# Patient Record
Sex: Female | Born: 1990 | Race: Asian | Hispanic: No | Marital: Single
Health system: Southern US, Community
[De-identification: ages and names within clinical notes are randomized; demographics above are authoritative.]

---

## 2013-08-10 ENCOUNTER — Emergency Department (INDEPENDENT_AMBULATORY_CARE_PROVIDER_SITE_OTHER)
Admission: EM | Admit: 2013-08-10 | Discharge: 2013-08-10 | Disposition: A | Discharge status: Home / Self Care | Payer: Managed Care, Other (non HMO) | Source: Home / Self Care | Attending: Emergency Medicine | Admitting: Emergency Medicine

## 2013-08-10 ENCOUNTER — Encounter (HOSPITAL_COMMUNITY): Payer: Self-pay | Admitting: Emergency Medicine

## 2013-08-10 DIAGNOSIS — K3189 Other diseases of stomach and duodenum: Secondary | ICD-10-CM

## 2013-08-10 DIAGNOSIS — K30 Functional dyspepsia: Secondary | ICD-10-CM

## 2013-08-10 DIAGNOSIS — R1013 Epigastric pain: Secondary | ICD-10-CM

## 2013-08-10 MED ORDER — GI COCKTAIL ~~LOC~~
ORAL | Status: AC
  Filled 2013-08-10: qty 30

## 2013-08-10 MED ORDER — GI COCKTAIL ~~LOC~~
30.0000 mL | Freq: Once | ORAL | Status: AC
  Administered 2013-08-10: 30 mL via ORAL

## 2013-08-10 NOTE — ED Provider Notes (Signed)
CSN: [id number]     Arrival date & time 08/10/13  1803 History   First MD Initiated Contact with Patient 08/10/13 1831     Chief Complaint  Patient presents with  . Chest Pain   (Consider location/radiation/quality/duration/timing/severity/associated sxs/prior Treatment) HPI  23 year old F with chest pain. Located in subxiphoid area. Nonradiating. 3 days duration. Associated with intermittent shortness of breath. She has no history of pneumonia, blood clots, or cardiac disease. She takes no daily medications. She has tried ibuprofen for this without relief. She denies fever, chills, headache, swelling of her legs. No diarrhea or constipation. No nausea or vomiting.  History reviewed. No pertinent past medical history. No past surgical history on file. No family history on file. History  Substance Use Topics  . Smoking status: Not on file  . Smokeless tobacco: Not on file  . Alcohol Use: Not on file   OB History   Grav Para Term Preterm Abortions TAB SAB Ect Mult Living                 Review of Systems See history of present illness Allergies  Review of patient's allergies indicates no known allergies.  Home Medications   Prior to Admission medications   Not on File   BP 125/80  Pulse 74  Temp(Src) 99.1 F (37.3 C) (Oral)  Resp 14  SpO2 100%  LMP 07/18/2013 Physical Exam Gen: young asian female, well appearing, NAD, pleasant and conversant CV: RRR, no m/r/g, no JVD or carotid bruits, no chest wall tenderness Pulm: normal WOB, CTA-B Abd: soft, NDNT, NABS; mild tenderness to palpation subxiphoid area without any masses Extremities: no edema or joint tenderness Neuro/Psych: A&Ox4, normal affect, speech, and thought content  ED Course  Procedures (including critical care time) Labs Review Labs Reviewed - No data to display  Imaging Review No results found.   Date: 08/10/2013  Rate: 64  Rhythm: normal sinus rhythm  QRS Axis: normal  Intervals: normal  ST/T  Wave abnormalities: nonspecific ST changes  Conduction Disutrbances:none  Narrative Interpretation: normal ECG  Old EKG Reviewed: none available     MDM   1. Indigestion    Pt given GI cocktail with relief. This corroborates diagnosis of indigestion. Recommended tums or mylanta with prilosec if that does not get better. Pt agreeable.     Garnetta BuddyEdward V Jamesetta Greenhalgh, MD 08/10/13 [phone]

## 2013-08-10 NOTE — Discharge Instructions (Signed)
You should take mylanta or tums for next few days to see if this helps. If this does not help, then you can try Prilosec. You can buy that at the pharmacy. If the pain gets worse or you have shortness of breath, then please come back to the urgent care.  Sincerely,   Dr. Clinton SawyerWilliamson  Indigestion Indigestion is discomfort in the upper abdomen that is caused by underlying problems such as gastroesophageal reflux disease (GERD), ulcers, or gallbladder problems.  CAUSES  Indigestion can be caused by many things. Possible causes include:  Stomach acid in the esophagus.  Stomach infections, usually caused by the bacteria H. pylori.  Being overweight.  Hiatal hernia. This means part of the stomach pushes up through the diaphragm.  Overeating.  Emotional problems, such as stress, anxiety, or depression.  Poor nutrition.  Consuming too much alcohol, tobacco, or caffeine.  Consuming spicy foods, fats, peppermint, chocolate, tomato products, citrus, or fruit juices.  Medicines such as aspirin and other anti-inflammatory drugs, hormones, steroids, and thyroid medicines.  Gastroparesis. This is a condition in which the stomach does not empty properly.  Stomach cancer.  Pregnancy, due to an increase in hormone levels, a relaxation of muscles in the digestive tract, and pressure on the stomach from the growing fetus. SYMPTOMS   Uncomfortable feeling of fullness after eating.  Pain or burning sensation in the upper abdomen.  Bloating.  Belching and gas.  Nausea and vomiting.  Acidic taste in the mouth.  Burning sensation in the chest (heartburn). DIAGNOSIS  Your caregiver will review your medical history and perform a physical exam. Other tests, such as blood tests, stool tests, X-rays, and other imaging scans, may be done to check for more serious problems. TREATMENT  Liquid antacids and other drugs may be given to block stomach acid secretion. Medicines that increase esophageal  muscle tone may also be given to help reduce symptoms. If an infection is found, antibiotic medicine may be given. HOME CARE INSTRUCTIONS  Avoid foods and drinks that make your symptoms worse, such as:  Caffeine or alcoholic drinks.  Chocolate.  Peppermint or mint flavorings.  Garlic and onions.  Spicy foods.  Citrus fruits, such as oranges, lemons, or limes.  Tomato-based foods such as sauce, chili, salsa, and pizza.  Fried and fatty foods.  Avoid eating for the 3 hours prior to your bedtime.  Eat small, frequent meals instead of large meals.  Stop smoking if you smoke.  Maintain a healthy weight.  Wear loose-fitting clothing. Do not wear anything tight around your waist that causes pressure on your stomach.  Raise the head of your bed 4 to 8 inches with wood blocks to help you sleep. Extra pillows will not help.  Only take over-the-counter or prescription medicines as directed by your caregiver.  Do not take aspirin, ibuprofen, or other nonsteroidal anti-inflammatory drugs (NSAIDs). SEEK IMMEDIATE MEDICAL CARE IF:   You are not better after 2 days.  You have chest pressure or pain that radiates up into your neck, arms, back, jaw, or upper abdomen.  You have difficulty swallowing.  You keep vomiting.  You have black or bloody stools.  You have a fever.  You have dizziness, fainting, difficulty breathing, or heavy sweating.  You have severe abdominal pain.  You lose weight without trying. MAKE SURE YOU:  Understand these instructions.  Will watch your condition.  Will get help right away if you are not doing well or get worse. Document Released: 04/18/2004 Document Revised: 06/03/2011  Document Reviewed: 10/24/2010 [facility] Patient Information 2014 [place], [place].

## 2013-08-10 NOTE — ED Notes (Signed)
C/o mid chest pain States she does have a lot of headache Does eat spicy food Did take advil as tx

## 2013-08-12 NOTE — ED Provider Notes (Signed)
Medical screening examination/treatment/procedure(s) were performed by a resident physician and as supervising physician I was immediately available for consultation/collaboration.  Leslee Homeavid Rithwik Schmieg, M.D.  Reuben Likesavid C Develle Sievers, MD 08/12/13 [phone]

## 2015-08-05 ENCOUNTER — Emergency Department (HOSPITAL_COMMUNITY): Payer: Managed Care, Other (non HMO)

## 2015-08-05 ENCOUNTER — Emergency Department (HOSPITAL_COMMUNITY)
Admission: EM | Admit: 2015-08-05 | Discharge: 2015-08-05 | Disposition: A | Discharge status: Home / Self Care | Payer: Managed Care, Other (non HMO) | Attending: Emergency Medicine | Admitting: Emergency Medicine

## 2015-08-05 ENCOUNTER — Encounter (HOSPITAL_COMMUNITY): Payer: Self-pay | Admitting: Adult Health

## 2015-08-05 DIAGNOSIS — M542 Cervicalgia: Secondary | ICD-10-CM | POA: Insufficient documentation

## 2015-08-05 DIAGNOSIS — R509 Fever, unspecified: Secondary | ICD-10-CM

## 2015-08-05 DIAGNOSIS — R0602 Shortness of breath: Secondary | ICD-10-CM | POA: Diagnosis not present

## 2015-08-05 DIAGNOSIS — R51 Headache: Secondary | ICD-10-CM | POA: Insufficient documentation

## 2015-08-05 DIAGNOSIS — K529 Noninfective gastroenteritis and colitis, unspecified: Secondary | ICD-10-CM | POA: Insufficient documentation

## 2015-08-05 DIAGNOSIS — R05 Cough: Secondary | ICD-10-CM

## 2015-08-05 DIAGNOSIS — Z792 Long term (current) use of antibiotics: Secondary | ICD-10-CM | POA: Diagnosis not present

## 2015-08-05 DIAGNOSIS — Z79899 Other long term (current) drug therapy: Secondary | ICD-10-CM | POA: Diagnosis not present

## 2015-08-05 DIAGNOSIS — Z3202 Encounter for pregnancy test, result negative: Secondary | ICD-10-CM | POA: Insufficient documentation

## 2015-08-05 DIAGNOSIS — F509 Eating disorder, unspecified: Secondary | ICD-10-CM | POA: Diagnosis present

## 2015-08-05 DIAGNOSIS — M545 Low back pain: Secondary | ICD-10-CM | POA: Insufficient documentation

## 2015-08-05 DIAGNOSIS — R1033 Periumbilical pain: Secondary | ICD-10-CM

## 2015-08-05 DIAGNOSIS — R110 Nausea: Secondary | ICD-10-CM

## 2015-08-05 DIAGNOSIS — R059 Cough, unspecified: Secondary | ICD-10-CM

## 2015-08-05 DIAGNOSIS — R197 Diarrhea, unspecified: Secondary | ICD-10-CM

## 2015-08-05 LAB — CBC WITH DIFFERENTIAL/PLATELET
BASOS ABS: 0 10*3/uL (ref 0.0–0.1)
Basophils Relative: 0 %
Eosinophils Absolute: 0 10*3/uL (ref 0.0–0.7)
Eosinophils Relative: 0 %
HEMATOCRIT: 39.4 % (ref 36.0–46.0)
Hemoglobin: 12.8 g/dL (ref 12.0–15.0)
Lymphocytes Relative: 8 %
Lymphs Abs: 0.9 10*3/uL (ref 0.7–4.0)
MCH: 26.4 pg (ref 26.0–34.0)
MCHC: 32.5 g/dL (ref 30.0–36.0)
MCV: 81.2 fL (ref 78.0–100.0)
Monocytes Absolute: 0.5 10*3/uL (ref 0.1–1.0)
Monocytes Relative: 5 %
NEUTROS ABS: 10 10*3/uL — AB (ref 1.7–7.7)
Neutrophils Relative %: 87 %
Platelets: 173 10*3/uL (ref 150–400)
RBC: 4.85 MIL/uL (ref 3.87–5.11)
RDW: 13 % (ref 11.5–15.5)
WBC: 11.4 10*3/uL — ABNORMAL HIGH (ref 4.0–10.5)

## 2015-08-05 LAB — COMPREHENSIVE METABOLIC PANEL
ALT: 11 U/L — AB (ref 14–54)
AST: 19 U/L (ref 15–41)
Albumin: 3.5 g/dL (ref 3.5–5.0)
Alkaline Phosphatase: 55 U/L (ref 38–126)
Anion gap: 11 (ref 5–15)
BILIRUBIN TOTAL: 0.5 mg/dL (ref 0.3–1.2)
BUN: 9 mg/dL (ref 6–20)
CO2: 22 mmol/L (ref 22–32)
CREATININE: 0.97 mg/dL (ref 0.44–1.00)
Calcium: 8.9 mg/dL (ref 8.9–10.3)
Chloride: 103 mmol/L (ref 101–111)
Glucose, Bld: 108 mg/dL — ABNORMAL HIGH (ref 65–99)
Potassium: 3.9 mmol/L (ref 3.5–5.1)
Sodium: 136 mmol/L (ref 135–145)
Total Protein: 7.7 g/dL (ref 6.5–8.1)

## 2015-08-05 LAB — URINE MICROSCOPIC-ADD ON

## 2015-08-05 LAB — URINALYSIS, ROUTINE W REFLEX MICROSCOPIC
Bilirubin Urine: NEGATIVE
Glucose, UA: NEGATIVE mg/dL
Ketones, ur: 15 mg/dL — AB
Nitrite: NEGATIVE
Protein, ur: 30 mg/dL — AB
Specific Gravity, Urine: 1.024 (ref 1.005–1.030)
pH: 6 (ref 5.0–8.0)

## 2015-08-05 LAB — RAPID STREP SCREEN (MED CTR MEBANE ONLY): Streptococcus, Group A Screen (Direct): NEGATIVE

## 2015-08-05 LAB — I-STAT CG4 LACTIC ACID, ED
LACTIC ACID, VENOUS: 1.54 mmol/L (ref 0.5–2.0)
Lactic Acid, Venous: 0.67 mmol/L (ref 0.5–2.0)

## 2015-08-05 LAB — PREGNANCY, URINE: Preg Test, Ur: NEGATIVE

## 2015-08-05 LAB — MONONUCLEOSIS SCREEN: MONO SCREEN: NEGATIVE

## 2015-08-05 MED ORDER — ACETAMINOPHEN 325 MG PO TABS
650.0000 mg | ORAL_TABLET | Freq: Once | ORAL | Status: AC | PRN
  Administered 2015-08-05: 650 mg via ORAL
  Filled 2015-08-05: qty 2

## 2015-08-05 MED ORDER — METRONIDAZOLE 500 MG PO TABS: 500.0000 mg | ORAL_TABLET | Freq: Two times a day (BID) | ORAL | Status: AC

## 2015-08-05 MED ORDER — ALBUTEROL SULFATE HFA 108 (90 BASE) MCG/ACT IN AERS
1.0000 | INHALATION_SPRAY | Freq: Four times a day (QID) | RESPIRATORY_TRACT | Status: DC | PRN
  Administered 2015-08-05: 1 via RESPIRATORY_TRACT
  Filled 2015-08-05: qty 6.7

## 2015-08-05 MED ORDER — DIATRIZOATE MEGLUMINE & SODIUM 66-10 % PO SOLN
ORAL | Status: AC
  Filled 2015-08-05: qty 30

## 2015-08-05 MED ORDER — ONDANSETRON HCL 4 MG/2ML IJ SOLN
4.0000 mg | Freq: Once | INTRAMUSCULAR | Status: DC
  Filled 2015-08-05: qty 2

## 2015-08-05 MED ORDER — ONDANSETRON HCL 4 MG PO TABS: 4.0000 mg | ORAL_TABLET | Freq: Four times a day (QID) | ORAL | Status: AC

## 2015-08-05 MED ORDER — IOPAMIDOL (ISOVUE-300) INJECTION 61%
INTRAVENOUS | Status: AC
  Administered 2015-08-05: 100 mL
  Filled 2015-08-05: qty 100

## 2015-08-05 MED ORDER — CIPROFLOXACIN HCL 500 MG PO TABS: 500.0000 mg | ORAL_TABLET | Freq: Two times a day (BID) | ORAL | Status: AC

## 2015-08-05 MED ORDER — BENZONATATE 100 MG PO CAPS: 100.0000 mg | ORAL_CAPSULE | Freq: Three times a day (TID) | ORAL | Status: AC

## 2015-08-05 MED ORDER — SODIUM CHLORIDE 0.9 % IV BOLUS (SEPSIS)
1000.0000 mL | Freq: Once | INTRAVENOUS | Status: AC
  Administered 2015-08-05: 1000 mL via INTRAVENOUS

## 2015-08-05 NOTE — Discharge Instructions (Signed)
Medications: Flagyl, Cipro, Zofran, Tessalon, albuterol  Treatment: Take Flagyl and Cipro as prescribed for 7 days. Take Zofran every 6 hours as needed for nausea and vomiting. Take Tessalon every 8 hours as needed for cough. Take 1-2 puffs of albuterol at night to see if it improves your shortness of breath. Get plenty of rest. Stay hydrated as best as you can, but drinking at least 8 glasses of water per day.  Follow-up: Please follow-up with your family doctor on Monday for follow-up of today's visit and further evaluation of your symptoms. Please return the emergency Department if you develop any new or worsening symptoms.    Abdominal Pain, Adult Many things can cause abdominal pain. Usually, abdominal pain is not caused by a disease and will improve without treatment. It can often be observed and treated at home. Your health care provider will do a physical exam and possibly order blood tests and X-rays to help determine the seriousness of your pain. However, in many cases, more time must pass before a clear cause of the pain can be found. Before that point, your health care provider may not know if you need more testing or further treatment. HOME CARE INSTRUCTIONS Monitor your abdominal pain for any changes. The following actions may help to alleviate any discomfort you are experiencing:  Only take over-the-counter or prescription medicines as directed by your health care provider.  Do not take laxatives unless directed to do so by your health care provider.  Try a clear liquid diet (broth, tea, or water) as directed by your health care provider. Slowly move to a bland diet as tolerated. SEEK MEDICAL CARE IF:  You have unexplained abdominal pain.  You have abdominal pain associated with nausea or diarrhea.  You have pain when you urinate or have a bowel movement.  You experience abdominal pain that wakes you in the night.  You have abdominal pain that is worsened or improved by  eating food.  You have abdominal pain that is worsened with eating fatty foods.  You have a fever. SEEK IMMEDIATE MEDICAL CARE IF:  Your pain does not go away within 2 hours.  You keep throwing up (vomiting).  Your pain is felt only in portions of the abdomen, such as the right side or the left lower portion of the abdomen.  You pass bloody or black tarry stools. MAKE SURE YOU:  Understand these instructions.  Will watch your condition.  Will get help right away if you are not doing well or get worse.   This information is not intended to replace advice given to you by your health care provider. Make sure you discuss any questions you have with your health care provider.   Document Released: 12/19/2004 Document Revised: 11/30/2014 Document Reviewed: 11/18/2012 Elsevier Interactive Patient Education [employer] Inc.

## 2015-08-05 NOTE — ED Notes (Addendum)
Presents with neck, head and abdominal pain began Thursday associated with fever.  Endorses diarrhea and nausea. Reports she is drinking and able to hold down fluids, alert and oreineted. Temp here 101.1 orally. Took tylenol last night at 8 pm. Denies photophobia.

## 2015-08-05 NOTE — ED Notes (Signed)
Pt ambulatory w/ steady gait to restroom. 

## 2015-08-05 NOTE — ED Provider Notes (Signed)
CSN: [id number]     Arrival date & time 08/05/15  [id number] History   First MD Initiated Contact with Patient 08/05/15 0546     Chief Complaint  Patient presents with  . Fever     (Consider location/radiation/quality/duration/timing/severity/associated sxs/prior Treatment) HPI Comments: Patient is a previously healthy 25 year old female who presents her cough, nausea, diarrhea, headache, fever, neck pain, back pain. Patient states her symptoms began Thursday night. She describes her cough is dry and nonproductive. She has had associated shortness of breath worse at night with exertion. Her diarrhea is nonbloody and described as loose stools. She has also had associated intermittent minimal lower abdominal pain that she describes as stabbing. She states that the pain is 9/10 when it comes. She has had a decreased appetite has been tolerating food and fluid. She denies vomiting. She states she has been urinating more since yesterday. Patient's LMP was this past week. She denies any abnormal vaginal discharge. Patient has taken Advil at home with some relief.  Patient is a 25 y.o. female presenting with fever. The history is provided by the patient.  Fever Associated symptoms: cough, diarrhea, headaches and nausea   Associated symptoms: no chest pain, no chills, no dysuria, no rash, no sore throat and no vomiting     History reviewed. No pertinent past medical history. History reviewed. No pertinent past surgical history. History reviewed. No pertinent family history. Social History  Substance Use Topics  . Smoking status: None  . Smokeless tobacco: None  . Alcohol Use: None   OB History    No data available     Review of Systems  Constitutional: Positive for fever. Negative for chills.  HENT: Negative for facial swelling and sore throat.   Eyes: Negative for photophobia.  Respiratory: Positive for cough and shortness of breath.   Cardiovascular: Negative for chest pain.   Gastrointestinal: Positive for nausea, abdominal pain and diarrhea. Negative for vomiting.  Genitourinary: Negative for dysuria.  Musculoskeletal: Positive for back pain and neck pain. Negative for neck stiffness.  Skin: Negative for rash and wound.  Neurological: Positive for headaches.  Psychiatric/Behavioral: The patient is not nervous/anxious.       Allergies  Review of patient's allergies indicates no known allergies.  Home Medications   Prior to Admission medications   Medication Sig Start Date End Date Taking? Authorizing Provider  acetaminophen (TYLENOL) 500 MG tablet Take 500-1,000 mg by mouth every 6 (six) hours as needed for moderate pain or fever.   Yes Historical Provider, MD  ibuprofen (ADVIL,MOTRIN) 200 MG tablet Take 400 mg by mouth every 6 (six) hours as needed for moderate pain.   Yes Historical Provider, MD  benzonatate (TESSALON) 100 MG capsule Take 1 capsule (100 mg total) by mouth every 8 (eight) hours. 08/05/15   Emi HolesAlexandra M Estrellita Lasky, PA-C  ciprofloxacin (CIPRO) 500 MG tablet Take 1 tablet (500 mg total) by mouth 2 (two) times daily. 08/05/15   Emi HolesAlexandra M Sylis Ketchum, PA-C  metroNIDAZOLE (FLAGYL) 500 MG tablet Take 1 tablet (500 mg total) by mouth 2 (two) times daily. 08/05/15   Darreld Hoffer M Charlye Spare, PA-C  ondansetron (ZOFRAN) 4 MG tablet Take 1 tablet (4 mg total) by mouth every 6 (six) hours. 08/05/15   Emi HolesAlexandra M Jad Johansson, PA-C   BP 112/73 mmHg  Pulse 109  Temp(Src) 101.3 F (38.5 C) (Oral)  Resp 20  SpO2 100%  LMP 08/03/2015 (Exact Date) Physical Exam  Constitutional: She appears well-developed and well-nourished. No distress.  HENT:  Head: Normocephalic  and atraumatic.  Mouth/Throat: Posterior oropharyngeal erythema present. No oropharyngeal exudate or tonsillar abscesses.  Eyes: Conjunctivae and EOM are normal. Pupils are equal, round, and reactive to light. Right eye exhibits no discharge. Left eye exhibits no discharge. No scleral icterus.  Neck: Normal range of motion  and full passive range of motion without pain. Neck supple. No spinous process tenderness and no muscular tenderness present. No rigidity. Normal range of motion present. No Brudzinski's sign and no Kernig's sign noted. No thyromegaly present.  Cardiovascular: Normal rate, regular rhythm, normal heart sounds and intact distal pulses.  Exam reveals no gallop and no friction rub.   No murmur heard. Pulmonary/Chest: Effort normal and breath sounds normal. No stridor. No respiratory distress. She has no decreased breath sounds. She has no wheezes. She has no rhonchi. She has no rales.  Abdominal: Soft. Bowel sounds are normal. She exhibits no distension. There is tenderness in the periumbilical area. There is no rebound, no guarding, no tenderness at McBurney's point and negative Murphy's sign.  Musculoskeletal: She exhibits no edema.  Lymphadenopathy:    She has no cervical adenopathy.  Neurological: She is alert. Coordination and gait normal.  CN 3-12 intact, normal sensation throughout, 5/5 strength in all 4 extremities, equal bilateral grip strength, no ataxia with finger-to-nose  Skin: Skin is warm and dry. No rash noted. She is not diaphoretic. No pallor.  Psychiatric: She has a normal mood and affect.  Nursing note and vitals reviewed.   ED Course  Procedures (including critical care time) Labs Review Labs Reviewed  CBC WITH DIFFERENTIAL/PLATELET - Abnormal; Notable for the following:    WBC 11.4 (*)    Neutro Abs 10.0 (*)    All other components within normal limits  COMPREHENSIVE METABOLIC PANEL - Abnormal; Notable for the following:    Glucose, Bld 108 (*)    ALT 11 (*)    All other components within normal limits  URINALYSIS, ROUTINE W REFLEX MICROSCOPIC (NOT AT [facility]) - Abnormal; Notable for the following:    APPearance CLOUDY (*)    Hgb urine dipstick TRACE (*)    Ketones, ur 15 (*)    Protein, ur 30 (*)    Leukocytes, UA SMALL (*)    All other components within normal limits   URINE MICROSCOPIC-ADD ON - Abnormal; Notable for the following:    Squamous Epithelial / LPF 0-5 (*)    Bacteria, UA RARE (*)    All other components within normal limits  RAPID STREP SCREEN (NOT AT [facility])  URINE CULTURE  CULTURE, GROUP A STREP ([facility])  PREGNANCY, URINE  MONONUCLEOSIS SCREEN  I-STAT CG4 LACTIC ACID, ED  I-STAT CG4 LACTIC ACID, ED    Imaging Review Dg Chest 2 View  08/05/2015  CLINICAL DATA:  Neck, shoulder, and abdominal pain EXAM: CHEST  2 VIEW COMPARISON:  None. FINDINGS: The heart size and mediastinal contours are within normal limits. Both lungs are clear. The visualized skeletal structures are unremarkable. IMPRESSION: No active cardiopulmonary disease. Electronically Signed   By: Gerome Sam III M.D   On: 08/05/2015 08:08   Ct Abdomen Pelvis W Contrast  08/05/2015  CLINICAL DATA:  Mid abdominal pain, left lower abdominal pain starting Thursday, fever, diarrhea, nausea EXAM: CT ABDOMEN AND PELVIS WITH CONTRAST TECHNIQUE: Multidetector CT imaging of the abdomen and pelvis was performed using the standard protocol following bolus administration of intravenous contrast. CONTRAST:  ISOVUE-300 IOPAMIDOL (ISOVUE-300) INJECTION 61% COMPARISON:  None. FINDINGS: Lower chest:  The lung bases  are unremarkable. Hepatobiliary: Enhanced liver shows no focal mass. Gallbladder is contracted without evidence of calcified gallstones. Pancreas: No mass, inflammatory changes, or other significant abnormality. Spleen: Within normal limits in size and appearance. Adrenals/Urinary Tract: No adrenal gland mass. Enhanced kidneys are symmetrical in size. No hydronephrosis or hydroureter. The urinary bladder is unremarkable. Stomach/Bowel: There is no small bowel obstruction. No gastric outlet obstruction. No thickened or dilated small bowel loops. No pericecal inflammation. Normal appendix is noted in axial image 52. There is mild thickening of the right colonic wall. Mild thickening of  colonic wall in transverse colon and descending colon. Mild thickening of colonic wall in sigmoid colon. Findings are highly suspicious for diffuse colitis. No pericolonic abscess or perforation is noted. No evidence of colonic obstruction. Contrast material is noted throughout the colon. Vascular/Lymphatic: There is no aortic aneurysm. No retroperitoneal or mesenteric adenopathy. Reproductive: The uterus is anteflexed. No adnexal mass. The uterus is normal size. Other: No ascites or free abdominal air.  No inguinal adenopathy. Musculoskeletal: No destructive bony lesions are noted. Sagittal images of the spine shows alignment, disc spaces and vertebral body heights to be preserved. IMPRESSION: 1. There is diffuse mild thickening of colonic wall and minimal stranding of pericolonic fat. Findings are suspicious for diffuse colitis. Clinical correlation is necessary to exclude infectious colitis. 2. There is no pericolonic abscess. No small bowel obstruction. Normal appendix. No pericecal inflammation. 3. No hydronephrosis or hydroureter. 4. No ascites or free air. Electronically Signed   By: Natasha Mead M.D.   On: 08/05/2015 11:37   I have personally reviewed and evaluated these images and lab results as part of my medical decision-making.   EKG Interpretation None      MDM   Patient febrile in ED. CBC shows WBC 11.4. CMP shows ALT 11, glucose 108. Serial lactic acids 1.54, 0.67. Rapid strep negative. Monospot negative. Patient does have increased urination and borderline UA, so will not treat empirically. Urine culture sent and would treat if culture positive. Chest x-ray shows no active cardiopulmonary disease. CT abdomen and pelvis shows diffuse mild thickening of colonic wall and minimal stranding of pericolonic fat suspicious for diffuse colitis; no pericolonic abscess, small bowel obstruction, appendicitis, pericecal inflammation; no hydronephrosis or hydroureter; no ascites or free air. Will treat  patient with Cipro and Flagyl to cover for infectious colitis. Discharged with symptomatic treatment for cough and fever. Albuterol inhaler trial for shortness of breath most likely due to bronchitis. Patient to follow-up with family doctor on Monday. Patient in satisfactory condition on discharge. Patient also evaluated by Dr. Ranae Palms who is in agreement with plan.  Final diagnoses:  Cough  Nausea  Diarrhea, unspecified type  Fever, unspecified fever cause  Periumbilical abdominal pain  Colitis        Emi Holes, PA-C 08/05/15 1445  Loren Racer, MD 08/06/15 [phone]

## 2015-08-05 NOTE — ED Notes (Signed)
CT called this RN to inform that pt will need to drink contrast prior to scan.

## 2015-08-05 NOTE — ED Notes (Signed)
Patient transported to X-ray 

## 2015-08-05 NOTE — ED Notes (Signed)
Pt returned to room from xray.

## 2015-08-05 NOTE — ED Notes (Signed)
Pt verbalized understanding of d/c instructions, prescriptions, and follow-up care. No further questions/concerns, VSS, ambulatory w/ steady gait (refused wheelchair) 

## 2015-08-06 LAB — URINE CULTURE

## 2015-08-07 LAB — CULTURE, GROUP A STREP (THRC)

## 2017-01-21 IMAGING — CT CT ABD-PELV W/ CM
2 of 4 series · 12 of 46 positions shown, 14 images · IV contrast (Iodine)
Comparison: None.

CLINICAL DATA: Mid abdominal pain, left lower abdominal pain
starting [REDACTED], fever, diarrhea, nausea

EXAM:
CT ABDOMEN AND PELVIS WITH CONTRAST
TECHNIQUE: Multidetector CT imaging of the abdomen and pelvis was performed
using the standard protocol following bolus administration of
intravenous contrast.
CONTRAST:  100mL R5W343-OFF IOPAMIDOL (R5W343-OFF) INJECTION 61%

[Series 201: routine, idose (2) · axial · 0.58mm/px · z∈[-406,-46]mm · 9 of 88 slices shown, 11 images]
[im 8/88  soft-tissue]
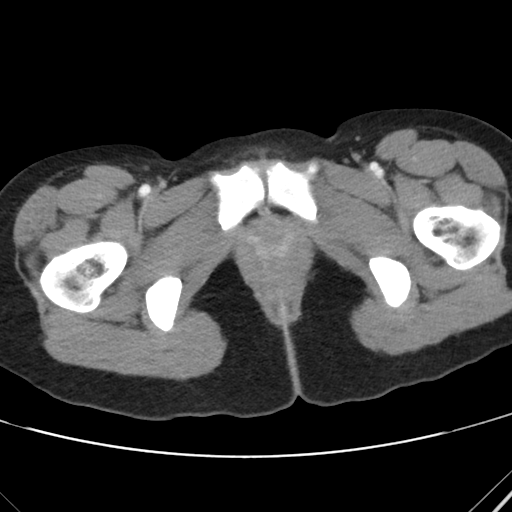
[im 8/88  bone]
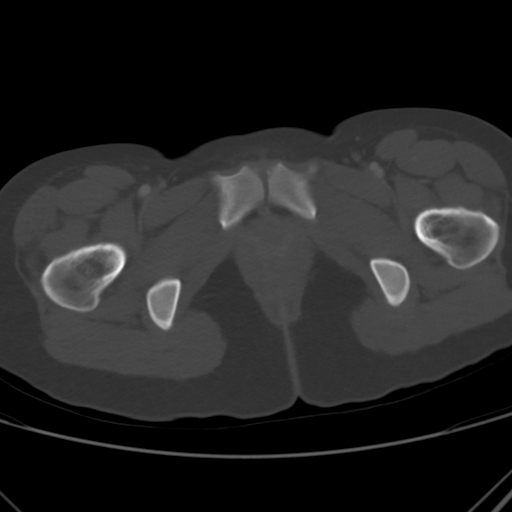
[im 15/88  soft-tissue]
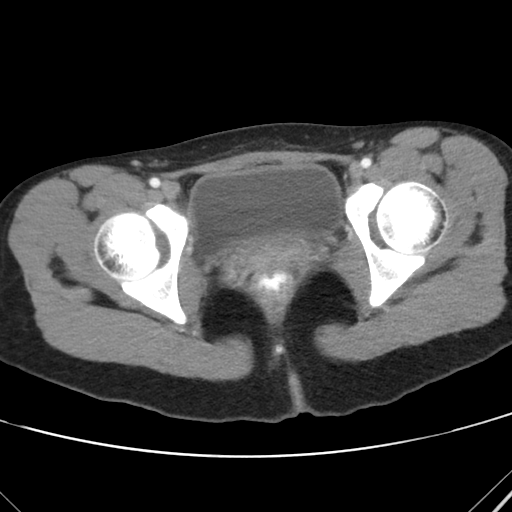
[im 26/88  soft-tissue]
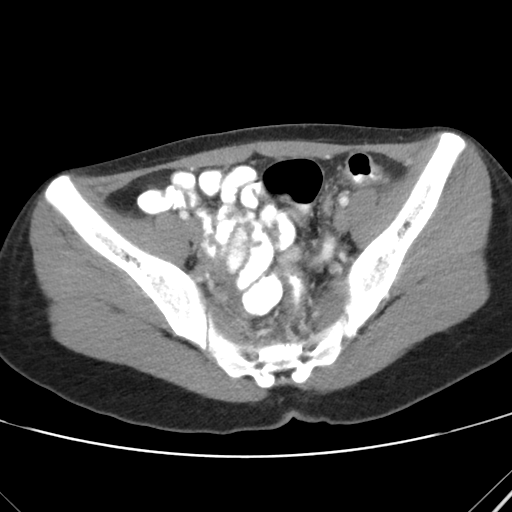
[im 33/88  soft-tissue]
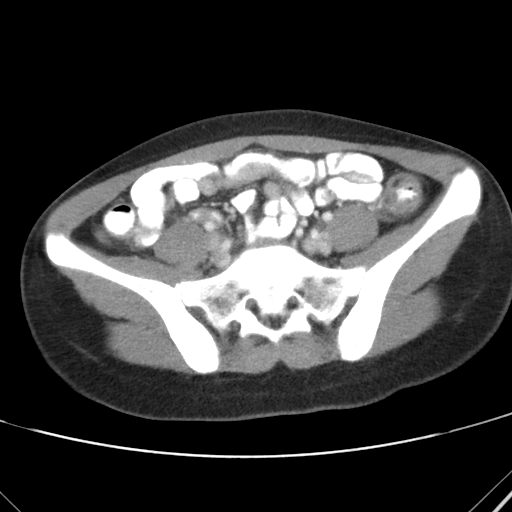
[im 44/88  soft-tissue]
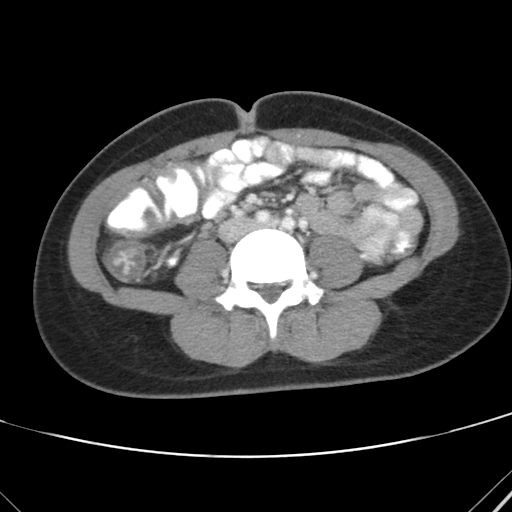
[im 55/88  soft-tissue]
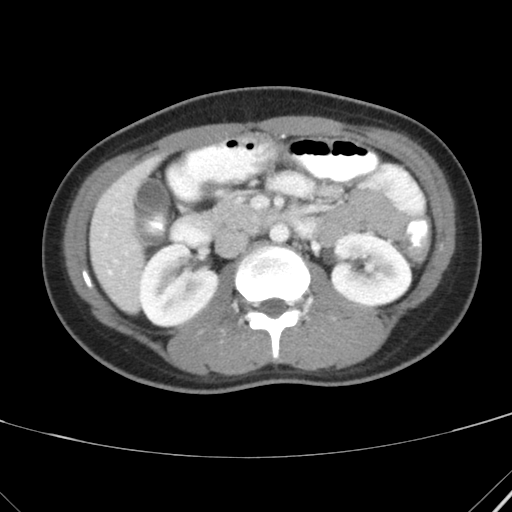
[im 62/88  soft-tissue]
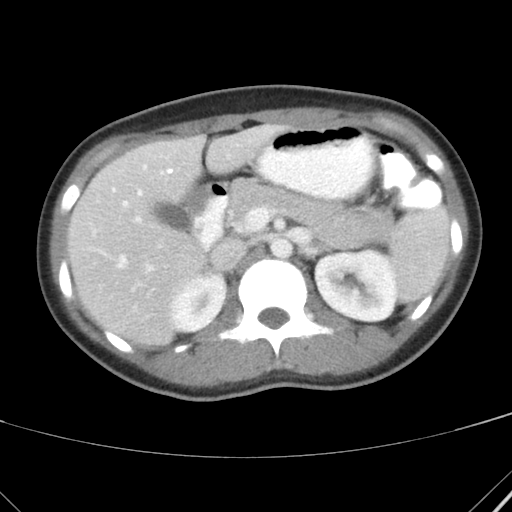
[im 73/88  soft-tissue]
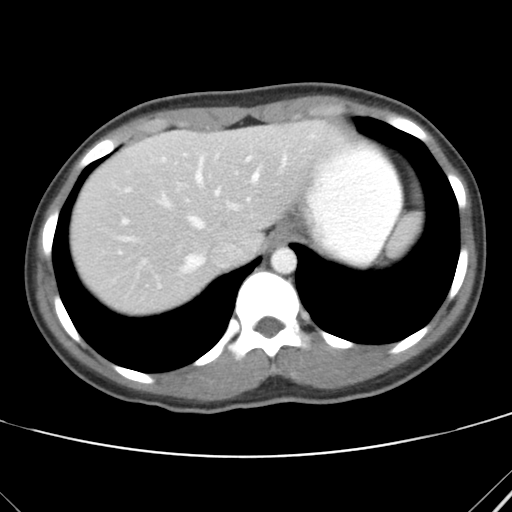
[im 80/88  soft-tissue]
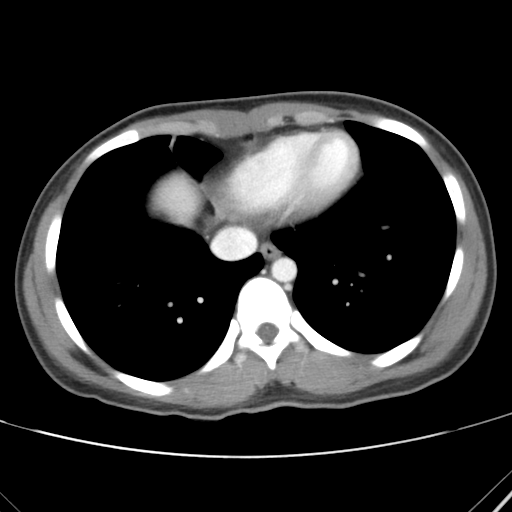
[im 80/88  bone]
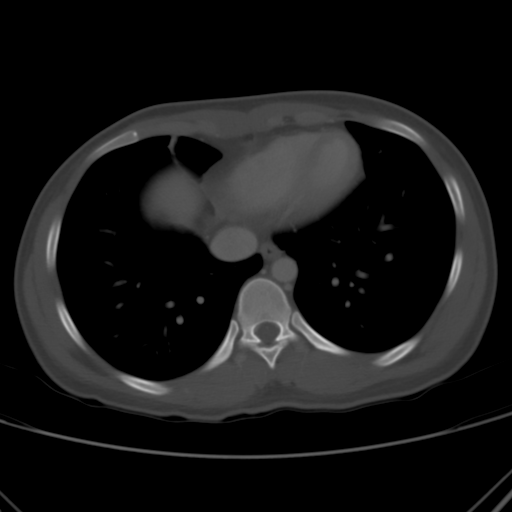

[Series 203: coronals, idose (2) · coronal · 0.45mm/px · 3 of 88 slices shown]
[im 30/88  soft-tissue]
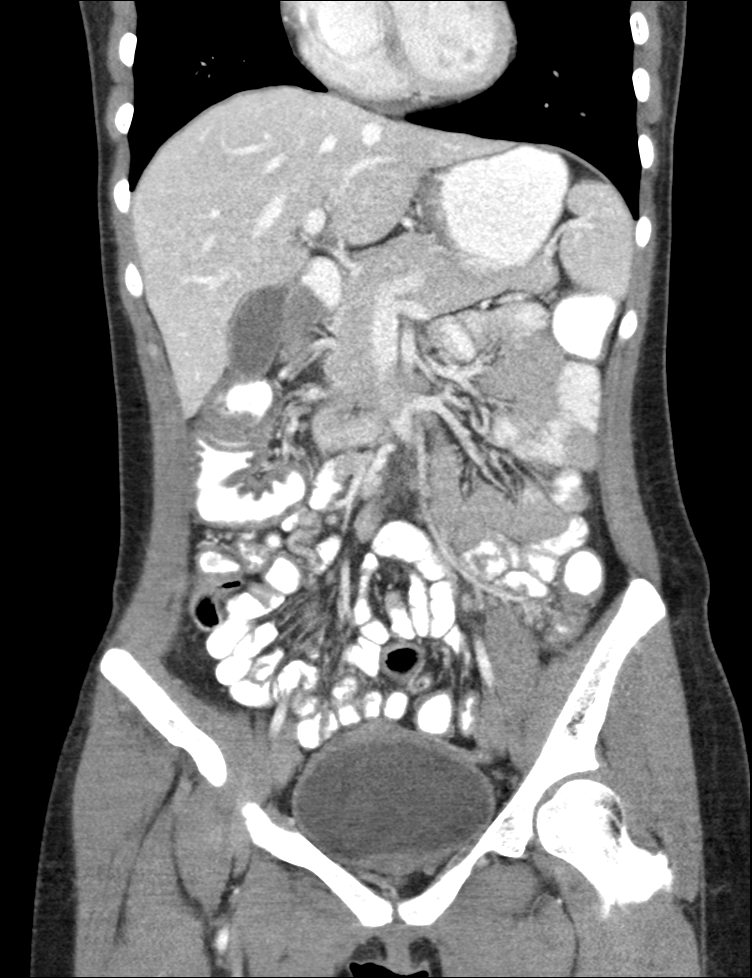
[im 39/88  soft-tissue]
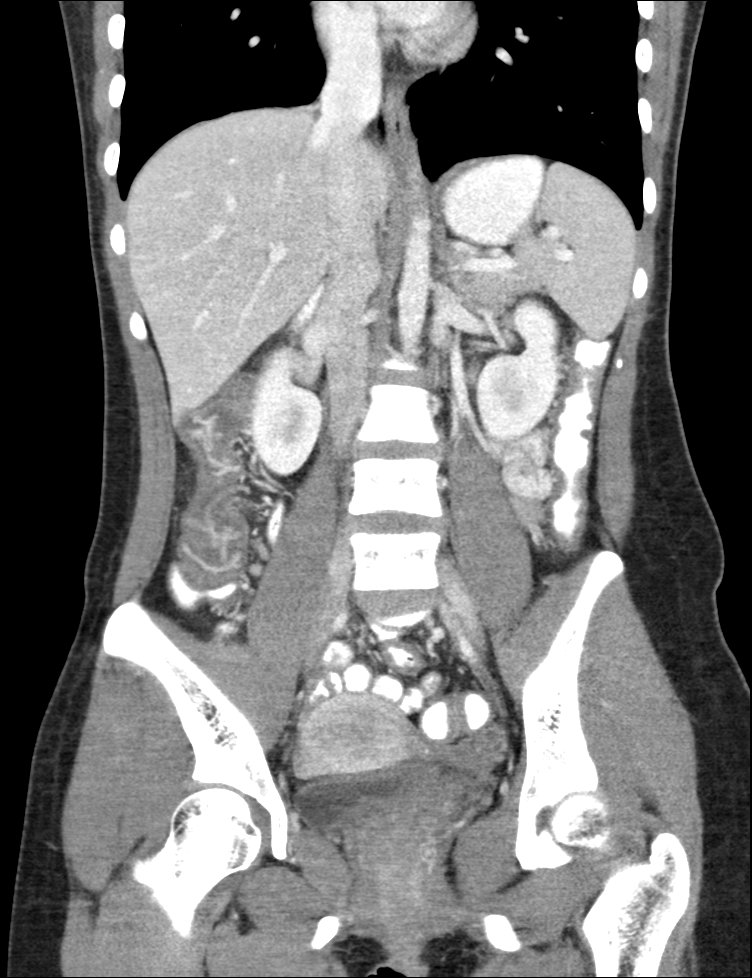
[im 49/88  soft-tissue]
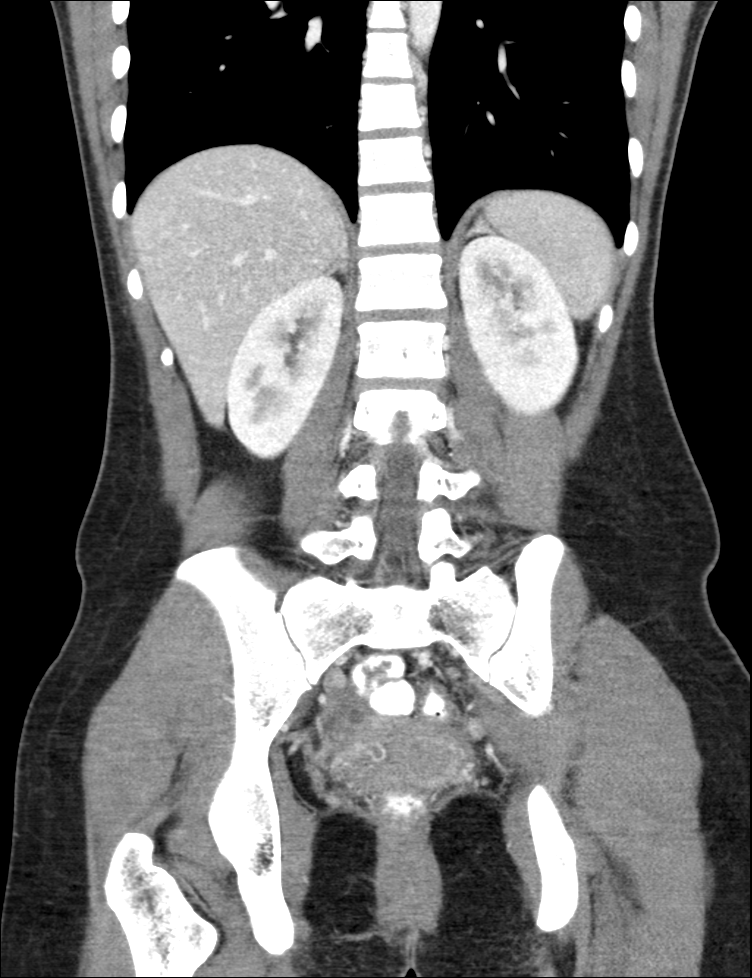

[12 of 46 positions shown; findings below may reference images not displayed]

FINDINGS: Lower chest:  The lung bases are unremarkable.

Hepatobiliary: Enhanced liver shows no focal mass. Gallbladder is
contracted without evidence of calcified gallstones.

Pancreas: No mass, inflammatory changes, or other significant
abnormality.

Spleen: Within normal limits in size and appearance.

Adrenals/Urinary Tract: No adrenal gland mass. Enhanced kidneys are
symmetrical in size. No hydronephrosis or hydroureter. The urinary
bladder is unremarkable.

Stomach/Bowel: There is no small bowel obstruction. No gastric
outlet obstruction. No thickened or dilated small bowel loops.

No pericecal inflammation. Normal appendix is noted in axial image
52. There is mild thickening of the right colonic wall. Mild
thickening of colonic wall in transverse colon and descending colon.
Mild thickening of colonic wall in sigmoid colon. Findings are
highly suspicious for diffuse colitis. No pericolonic abscess or
perforation is noted. No evidence of colonic obstruction. Contrast
material is noted throughout the colon.

Vascular/Lymphatic: There is no aortic aneurysm. No retroperitoneal
or mesenteric adenopathy.

Reproductive: The uterus is anteflexed. No adnexal mass. The uterus
is normal size.

Other: No ascites or free abdominal air.  No inguinal adenopathy.

Musculoskeletal: No destructive bony lesions are noted. Sagittal
images of the spine shows alignment, disc spaces and vertebral body
heights to be preserved.
IMPRESSION: 1. There is diffuse mild thickening of colonic wall and minimal
stranding of pericolonic fat. Findings are suspicious for diffuse
colitis. Clinical correlation is necessary to exclude infectious
colitis.
2. There is no pericolonic abscess. No small bowel obstruction.
Normal appendix. No pericecal inflammation.
3. No hydronephrosis or hydroureter.
4. No ascites or free air.

## 2017-01-21 IMAGING — DX DG CHEST 2V
2 series · 2 of 2 positions shown · non-contrast
Comparison: None.

CLINICAL DATA: Neck, shoulder, and abdominal pain

EXAM:
CHEST  2 VIEW

[w chest pa]
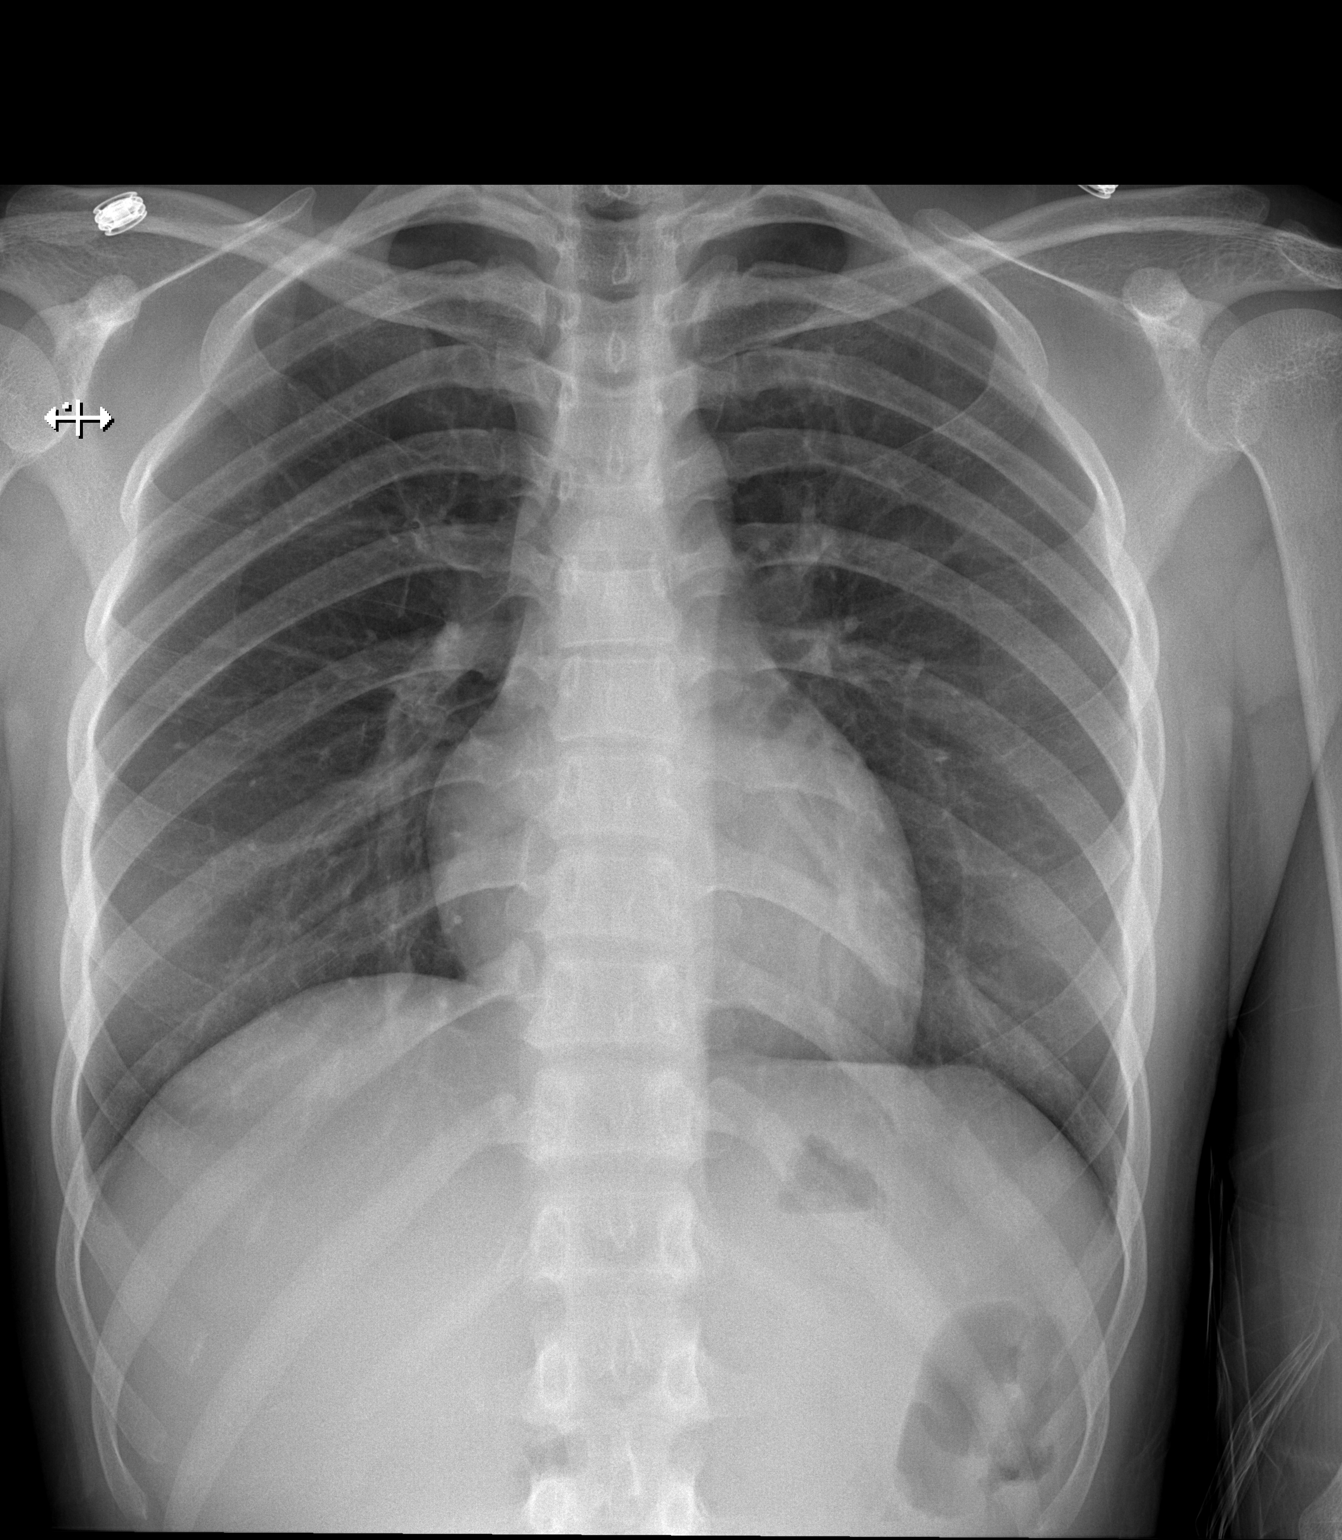

[w chest lat]
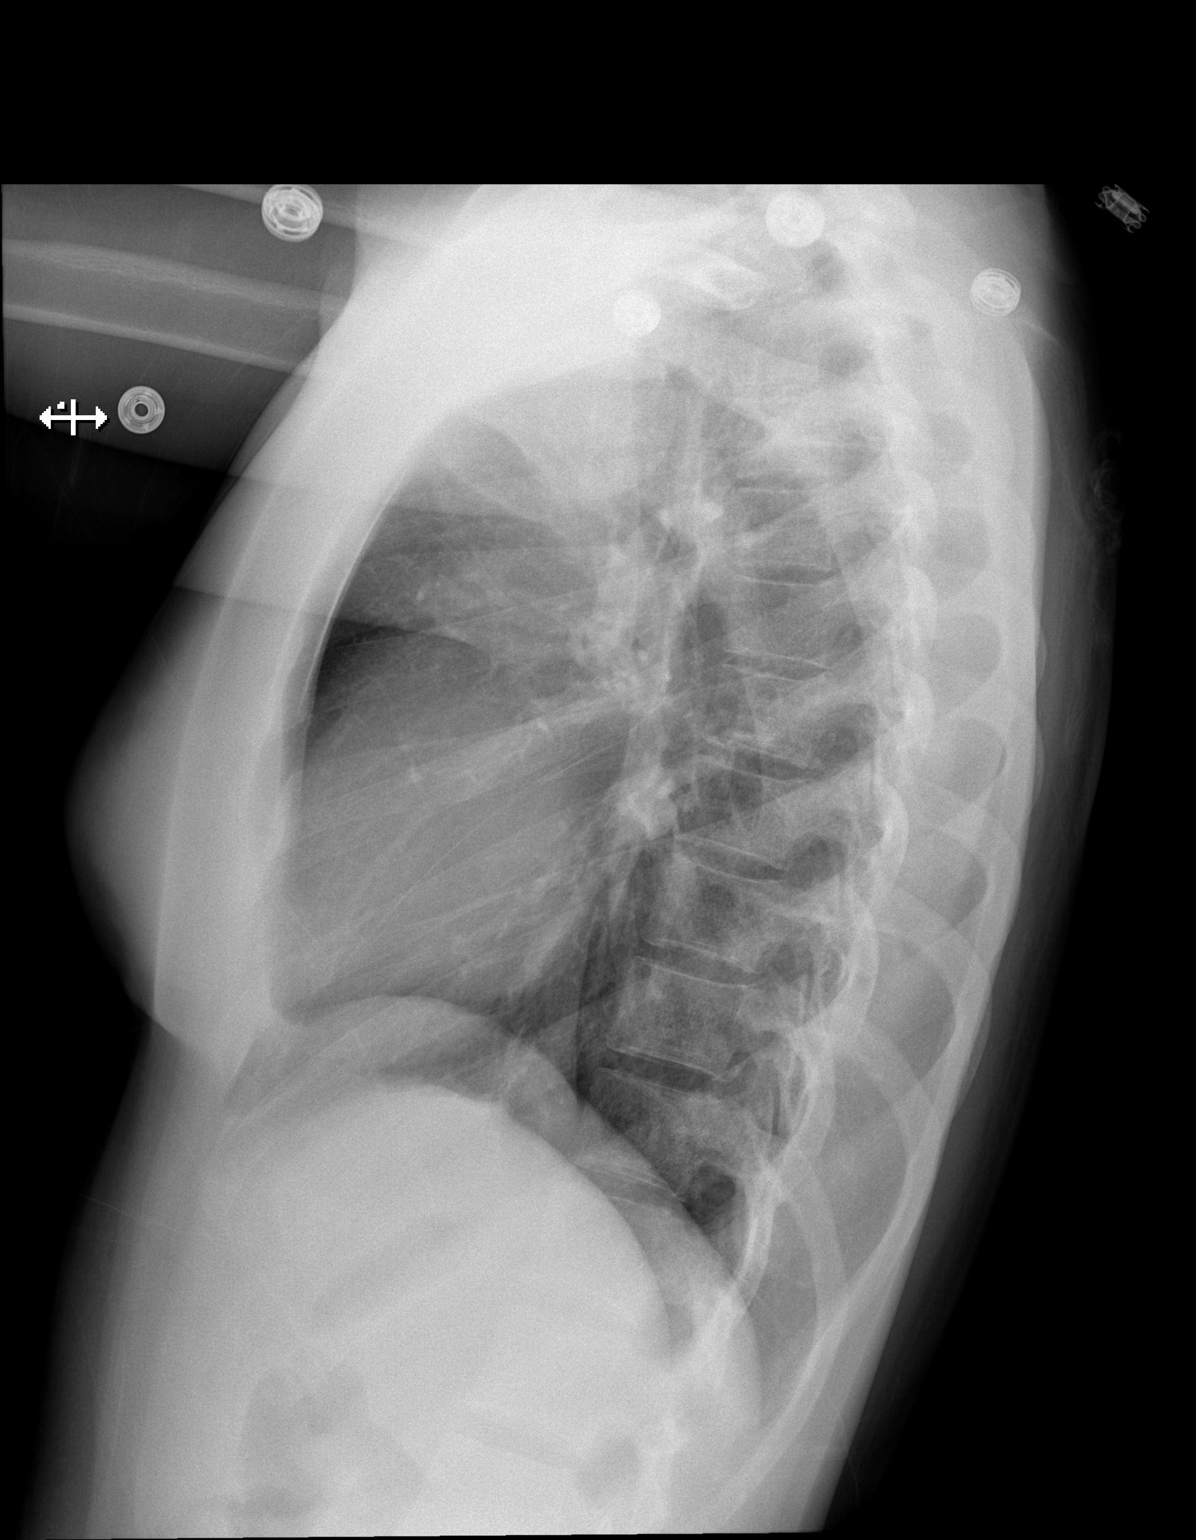

[2 of 2 positions shown; findings below may reference images not displayed]

FINDINGS: The heart size and mediastinal contours are within normal limits.
Both lungs are clear. The visualized skeletal structures are
unremarkable.
IMPRESSION: No active cardiopulmonary disease.
